# Patient Record
Sex: Male | Born: 2000 | Hispanic: Yes | Marital: Single | Smoking: Never smoker
Health system: Southern US, Community
[De-identification: ages and names within clinical notes are randomized; demographics above are authoritative.]

---
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
## 2023-02-18 ENCOUNTER — Emergency Department (HOSPITAL_COMMUNITY): Payer: Medicaid Other

## 2023-02-18 ENCOUNTER — Encounter (HOSPITAL_COMMUNITY): Payer: Self-pay

## 2023-02-18 ENCOUNTER — Emergency Department (HOSPITAL_COMMUNITY)
Admission: EM | Admit: 2023-02-18 | Discharge: 2023-02-18 | Disposition: A | Discharge status: Home / Self Care | Payer: Medicaid Other | Attending: Emergency Medicine | Admitting: Emergency Medicine

## 2023-02-18 ENCOUNTER — Other Ambulatory Visit: Payer: Self-pay

## 2023-02-18 DIAGNOSIS — F32A Depression, unspecified: Secondary | ICD-10-CM | POA: Diagnosis not present

## 2023-02-18 DIAGNOSIS — F339 Major depressive disorder, recurrent, unspecified: Secondary | ICD-10-CM | POA: Diagnosis present

## 2023-02-18 DIAGNOSIS — F331 Major depressive disorder, recurrent, moderate: Secondary | ICD-10-CM | POA: Diagnosis not present

## 2023-02-18 DIAGNOSIS — F411 Generalized anxiety disorder: Secondary | ICD-10-CM | POA: Diagnosis not present

## 2023-02-18 DIAGNOSIS — F419 Anxiety disorder, unspecified: Secondary | ICD-10-CM | POA: Insufficient documentation

## 2023-02-18 DIAGNOSIS — R000 Tachycardia, unspecified: Secondary | ICD-10-CM | POA: Diagnosis not present

## 2023-02-18 LAB — CBC
HCT: 49.4 % (ref 39.0–52.0)
Hemoglobin: 17 g/dL (ref 13.0–17.0)
MCH: 29.5 pg (ref 26.0–34.0)
MCHC: 34.4 g/dL (ref 30.0–36.0)
MCV: 85.6 fL (ref 80.0–100.0)
Platelets: 474 10*3/uL — ABNORMAL HIGH (ref 150–400)
RBC: 5.77 MIL/uL (ref 4.22–5.81)
RDW: 12.6 % (ref 11.5–15.5)
WBC: 16.1 10*3/uL — ABNORMAL HIGH (ref 4.0–10.5)
nRBC: 0 % (ref 0.0–0.2)

## 2023-02-18 LAB — RAPID URINE DRUG SCREEN, HOSP PERFORMED
Amphetamines: NOT DETECTED
Barbiturates: NOT DETECTED
Benzodiazepines: NOT DETECTED
Cocaine: NOT DETECTED
Opiates: NOT DETECTED
Tetrahydrocannabinol: NOT DETECTED

## 2023-02-18 LAB — COMPREHENSIVE METABOLIC PANEL
ALT: 105 U/L — ABNORMAL HIGH (ref 0–44)
AST: 49 U/L — ABNORMAL HIGH (ref 15–41)
Albumin: 4.6 g/dL (ref 3.5–5.0)
Alkaline Phosphatase: 112 U/L (ref 38–126)
Anion gap: 14 (ref 5–15)
BUN: 16 mg/dL (ref 6–20)
CO2: 21 mmol/L — ABNORMAL LOW (ref 22–32)
Calcium: 10 mg/dL (ref 8.9–10.3)
Chloride: 102 mmol/L (ref 98–111)
Creatinine, Ser: 0.85 mg/dL (ref 0.61–1.24)
GFR, Estimated: >60 mL/min (ref >60)
Glucose, Bld: 99 mg/dL (ref 70–99)
Potassium: 3.6 mmol/L (ref 3.5–5.1)
Sodium: 137 mmol/L (ref 135–145)
Total Bilirubin: 0.7 mg/dL (ref <1.2)
Total Protein: 8.6 g/dL — ABNORMAL HIGH (ref 6.5–8.1)

## 2023-02-18 LAB — ETHANOL: Alcohol, Ethyl (B): <10 mg/dL (ref <10)

## 2023-02-18 LAB — SALICYLATE LEVEL: Salicylate Lvl: <7 mg/dL — ABNORMAL LOW (ref 7.0–30.0)

## 2023-02-18 LAB — ACETAMINOPHEN LEVEL: Acetaminophen (Tylenol), Serum: <10 ug/mL — ABNORMAL LOW (ref 10–30)

## 2023-02-18 MED ORDER — HYDROXYZINE HCL 10 MG PO TABS: 10.0000 mg | ORAL_TABLET | Freq: Three times a day (TID) | ORAL | 0 refills | Status: AC | PRN

## 2023-02-18 MED ORDER — HYDROXYZINE HCL 10 MG PO TABS: 10.0000 mg | ORAL_TABLET | Freq: Three times a day (TID) | ORAL | Status: DC | PRN

## 2023-02-18 NOTE — ED Provider Triage Note (Signed)
Emergency Medicine Provider Triage Evaluation Note  Adryel Laible , a 22 y.o. male  was evaluated in triage.  Pt complains of AMS.  Has been acting strange since around 7PM per mom  has complained of headache recently. Bizarre behavior in triage.  Mom denies drugs or EtOH.  Review of Systems  Positive: Headache, AMS Negative: fever  Physical Exam  BP (!) 149/97 (BP Location: Right Arm)   Pulse (!) 110   Temp 98.3 F (36.8 C)   Resp 19   SpO2 99%  Gen:   Awake, no distress   Resp:  Normal effort  MSK:   Moves extremities without difficulty  Other:  Leaned back in wheelchair, mix of laughing/crying, not talking with spanish interpreter or answer questions when prompted  Medical Decision Making  Medically screening exam initiated at 1:30 AM.  Appropriate orders placed.  [name] was informed that the remainder of the evaluation will be completed by another provider, this initial triage assessment does not replace that evaluation, and the importance of remaining in the ED until their evaluation is complete.  AMS since 7pm.  Mix of laughing/crying in triage.  Also apparently has had headache.  Will get EKG, labs, head CT.   Garlon Hatchet, PA-C 02/18/23 [id number]

## 2023-02-18 NOTE — Consult Note (Addendum)
[facility] Psychiatric Consult Initial  Patient Name: .Steven Cole  MRN: [id number]  DOB: 03/14/2000  Consult Order details:  Orders (From admission, onward)     Start     Ordered   02/18/23 0324  CONSULT TO CALL ACT TEAM       Ordering Provider: Paris Lore, PA-C  Provider:  (Not yet assigned)  Question:  Reason for Consult?  Answer:  Transient manic behavior, hx of depression   02/18/23 0323             Mode of Visit: In person    Psychiatry Consult Evaluation  Service Date: February 18, 2023 LOS:  LOS: 0 days  Chief Complaint: "Nervous breakdown today"  Primary Psychiatric Diagnoses  MDD (major depressive disorder), recurrent episode, moderate (HCC)   Generalized anxiety disorder   Assessment  Steven Cole is a 22 y.o. Hispanic male with a past psychiatric history of unspecified depression, GAD, and adjustment disorder, with pertinent medical comorbidities/history that include none, who presents to this encounter by way of family over concerns of having a, "nervous breakdown today", thus psychiatry was consulted for evaluation and care measure recommendations. Patient is currently voluntary at this time and medically clear per EDP team.  Upon evaluation, patient presents with symptomology that is most consistent with a unipolar major depressive disorder recurrent episode moderate and a generalized anxiety disorder. From evaluation conducted, patient does not present as an imminent risk of self or others, and/or present decompensated into psychosis and/or mania, thus the recommendation is for psychiatric clearance at this time, as well as the additional recommendations listed below. There is no clinical suspicion for substance-induced mood disorder.  There is no clinical suspicion for a bipolar affective disorder.  Patient not amenable to starting scheduled Lexapro to address his mental health, but did endorse that he was amenable to as needed Atarax 10 mg  p.o. 3 times daily/therapy, and after review, no questions or concerns.  Spoke with Dr. Enedina Finner who is in agreement with plan of care and recommendation today for psychiatric clearance and subsequent discharge.  Diagnoses:  Active Hospital problems: Principal Problem:   MDD (major depressive disorder), recurrent episode, moderate (HCC) Active Problems:   Generalized anxiety disorder    Plan   ## Psychiatric Recommendations:   #MDD (major depressive disorder), recurrent episode, moderate (HCC) #Generalized anxiety disorder  -Recommend consider Lexapro 5 mg p.o. daily, if/when patient is amenable -Recommend restart therapy at patient's University -Recommend safety plan listed below -Recommend Atarax 10mg  p.o. TID PRN   Safety Plan Precious Derman will reach out to Mother, call 911 or call mobile crisis, or go to nearest emergency room if condition worsens or if suicidal thoughts become active Patients' will follow up with [facility] for outpatient psychiatric services (therapy/medication management).  The suicide prevention education provided includes the following: Suicide risk factors Suicide prevention and interventions National Suicide Hotline telephone number [facility] assessment telephone number [facility] Emergency Assistance 911 [facility] and/or Residential Mobile Crisis Unit telephone number Request made of family/significant other to:  Mother Remove weapons (e.g., guns, rifles, knives), all items previously/currently identified as safety concern.   Remove drugs/medications (over the counter, prescriptions, illicit drugs), all items previously/currently identified as a safety concern.   ## Medical Decision Making Capacity: Not specifically addressed in this encounter  ## Further Work-up: None at this time  ## Disposition:-- There are no psychiatric contraindications to discharge at this time  ## Behavioral / Environmental: -  No  specific recommendations at this time.     ## Safety and Observation Level:  - Based on my clinical evaluation, I estimate the patient to be at low risk of self harm in the current setting and upon recommendation for discharge. - At this time, we recommend  routine. This decision is based on my review of the chart including patient's history and current presentation, interview of the patient, mental status examination, and consideration of suicide risk including evaluating suicidal ideation, plan, intent, suicidal or self-harm behaviors, risk factors, and protective factors. This judgment is based on our ability to directly address suicide risk, implement suicide prevention strategies, and develop a safety plan while the patient is in the clinical setting. Please contact our team if there is a concern that risk level has changed.  CSSR Risk Category:C-SSRS RISK CATEGORY: No Risk  Suicide Risk Assessment: Patient has following modifiable risk factors for suicide: untreated depression, under treated depression , and social isolation, which we are addressing by recommendations. Patient has following non-modifiable or demographic risk factors for suicide: male gender Patient has the following protective factors against suicide: Access to outpatient mental health care, Supportive family, Supportive friends, no history of suicide attempts, and no history of NSSIB  Thank you for this consult request. Recommendations have been communicated to the primary team.  We will psychiatrically clear the patient at this time.   Lenox Ponds, NP       History of Present Illness   Steven Cole is a 22 y.o. Hispanic male with a past psychiatric history of unspecified depression, GAD, and adjustment disorder, with pertinent medical comorbidities/history that include none, who presents to this encounter by way of family over concerns of having a, "nervous breakdown today", thus psychiatry was consulted for  evaluation and care measure recommendations. Patient is currently voluntary at this time and medically clear per EDP team.  Patient seen today at the [facility] emergency department for face-to-face psychiatric evaluation.  Upon evaluation, patient endorses that last night him and his mother like they usually do were having an evening conversation talking about life, when he states that the conversation became "heated", over feeling dismissed by his mother when attempting to discuss how he is feeling about his life, resulting in a "nervous breakdown, I'm embarrassed honestly."   Expanding on the conversation he was having with his mother about how he has been feeling, and the parts of the conversation that felt, "heated", and made him feel, "dismissed", patient endorsed that he was largely telling his mom about how he feels lonely lately, that he feels like in life he has been "just dealt a terrible hand", he feels "useless to everyone", that 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  Patient endorses that more specifically he has been feeling a significant recrudescence of anxiety and depression over the last 3 weeks since he has been on break from [place] and back living at home with his mother, states that, "I do not know how to explain it, but when I am home, I just feel so isolated and alone, and my mind just wanders into all kinds of bad directions".  Discussing the symptoms he has been experiencing over the last 3 weeks, patient endorses he has been having worsening depressed and anxious mood frequently, anhedonia, increased agitation/irritability, feelings of worthlessness and inappropriate guilt, troubles with concentration, increased  generalized anxiety about psychosocial stressors, and just prior to coming in, states that his conversation with his mother resulted in a  panic attack.  Patient endorses that he does not have a history of utilizing outpatient medication management for his mental health, but does have a long and extensive history of participating in therapy for anxiety depression. Patient endorses that for many years he was being seen for therapy at [facility], but switched over to participating in therapy at his [place] when he started attending college; notably discontinued therapy 3 weeks ago, which is in line with a recrudescence of instability of his mental health.  Patient endorses no inpatient mental health hospitalizations.  Patient endorses no past or present drug use, tobacco use, and/or EtOH use.  Patient endorses no history of suicide attempts and/or self-injurious behavior.  Patient endorses no abuse of caffeine.  Patient endorses he has been sleeping fairly normal, states that because he is on break from school he does stay up, "probably" too late during the nights, but otherwise is sleeping again fairly normal.  Patient endorses no problems with appetite, states that he eats less intentionally, states that he is trying to be healthier.  Patient orientation was intact, no concerns for fluctuations of consciousness.  Patient endorsed no auditory or visual hallucinations, and objectively, did not appear to be presenting with psychotic features.  Patient endorsed no suicidal and/or homicidal ideations.  Patient endorsed that outside of the safe and secure environment of the hospital he could remain safe, states that he is goal-directed towards restarting therapy at his [facility], recommendation given to him today, in addition to the recommendation to consider psychopharmacological intervention in the form of Lexapro, but stated that he wanted to hold off at this time from starting scheduled medications, was amenable to as needed Atarax.  Collateral, patient's mother, Ms. Trisha Mangle, spoken to in person  Patient's mother affirms the information  provided by the patient, does not provide any additional details. Discussed with mother in combination with patient if she had any safety concerns with her son returning home with the recommendation for safety planning to be put in place, as well as to restart outpatient therapy at the patient's University, and consider medications if/when he is amenable, to which mother endorsed she did not have any concerns. Mother endorses no guns or dangerous items in the home that the patient potentially harm himself with.   Review of Systems  Psychiatric/Behavioral:  Positive for depression. Negative for hallucinations, substance abuse and suicidal ideas. The patient is nervous/anxious. The patient does not have insomnia.   All other systems reviewed and are negative.    Psychiatric and Social History  Psychiatric History:  Information collected from: Chart review, patient, patient's mother  Prev Dx/Sx: unspecified depression, GAD, and adjustment disorder Current Psych Provider: None Home Meds (current): None Previous Med Trials: None Therapy: [facility]); recent, [facility] (stopped 3 weeks ago)  Prior Psych Hospitalization: None endorsed Prior Self Harm: None endorsed Prior Violence: None endorsed  Family Psych History: None endorsed Family Hx suicide: None endorsed  Social History:  Developmental Hx: Within defined limits Educational Hx: Currently in [place] Occupational Hx: None endorsed Legal Hx: None endorsed Living Situation: Lives with mother outside of [place] Spiritual Hx: None endorsed Access to weapons/lethal means: None endorsed  Substance History Alcohol: Denied Tobacco: Denied Illicit drugs: Denied   Exam Findings  Physical Exam: As below Vital Signs:  Temp:  [97.4 F (36.3 C)-98.3 F (36.8 C)] 97.4 F (36.3 C) (12/29 0401) Pulse Rate:  [77-110] 77 (12/29 0401) Resp:   [16-28] 16 (12/29 0401) BP: (115-149)/(75-97) 115/75 (12/29 0401) SpO2:  [99 %-100 %] 100 % (12/29 0401) Weight:  [124.7 kg] 124.7 kg (12/29 0313) Blood pressure 115/75, pulse 77, temperature (!) 97.4 F (36.3 C), temperature source Oral, resp. rate 16, height 5\' 7"  (1.702 m), weight 124.7 kg, SpO2 100%. Body mass index is 43.07 kg/m.  Physical Exam Vitals and nursing note reviewed.  Constitutional:      General: He is not in acute distress.    Appearance: He is obese. He is not ill-appearing, toxic-appearing or diaphoretic.  Pulmonary:     Effort: Pulmonary effort is normal.  Skin:    General: Skin is warm and dry.  Neurological:     Mental Status: He is alert and oriented to person, place, and time.  Psychiatric:        Attention and Perception: Attention and perception normal. He does not perceive auditory or visual hallucinations.        Mood and Affect: Affect normal. Mood is depressed.        Speech: Speech normal.        Behavior: Behavior is cooperative.        Thought Content: Thought content normal. Thought content is not paranoid or delusional. Thought content does not include homicidal or suicidal ideation.        Cognition and Memory: Cognition and memory normal.        Judgment: Judgment normal.     Mental Status Exam: General Appearance: Casual  Orientation:  Other:  WDL  Memory:   WDL  Concentration:  Concentration: Good and Attention Span: Good  Recall:  Good  Attention  Good  Eye Contact:  Good  Speech:  Clear and Coherent and Normal Rate  Language:  Good  Volume:  Normal  Mood: "terrible"  Affect:   Neutral sad edge  Thought Process:  Coherent, Goal Directed, and Linear  Thought Content:  Negative  Suicidal Thoughts:  No  Homicidal Thoughts:  No  Judgement:  Intact  Insight:  Present and Shallow  Psychomotor Activity:  Normal  Akathisia:  No  Fund of Knowledge:  Good      Assets:  Communication Skills Desire for Improvement Financial  Resources/Insurance Housing Leisure Time Physical Health Resilience Social Support Talents/Skills Transportation Vocational/Educational  Cognition:  WNL  ADL's:  Intact  AIMS (if indicated):   0     Other History   These have been pulled in through the EMR, reviewed, and updated if appropriate.  Family History:  The patient's family history is not on file.  Medical History: History reviewed. No pertinent past medical history.  Surgical History: History reviewed. No pertinent surgical history.   Medications:  No current facility-administered medications for this encounter.  Current Outpatient Medications:    cetirizine (ZYRTEC) 10 MG tablet, TAKE 1 TABLET BY MOUTH EVERY DAY (Patient not taking: Reported on 12/13/2018), Disp: 30 tablet, Rfl: 6   fluticasone (FLONASE) 50 MCG/ACT nasal spray, Place 1 spray into both nostrils daily. (Patient not taking: Reported on 12/13/2018), Disp: 9.9 mL, Rfl: 2   glycopyrrolate (ROBINUL) 1 MG tablet, Take 1 tablet (1 mg total) by mouth daily. (Patient not taking: Reported on 12/13/2018), Disp: 60 tablet, Rfl: 0   Humidifiers (COOL MIST HUMIDIFIER) MISC, 1 Units by Does not apply route Nightly. (Patient not taking: Reported on 12/13/2018), Disp:  1 each, Rfl: 0   omeprazole (PRILOSEC) 40 MG capsule, Take 1 capsule (40 mg total) by mouth 2 (two) times daily. (Patient not taking: Reported on 08/18/2019), Disp: 30 capsule, Rfl: 0  Allergies: No Known Allergies  Lenox Ponds, NP

## 2023-02-18 NOTE — ED Notes (Signed)
Psych packet complete. Patient belongings in [place] [address] in purple [place]

## 2023-02-18 NOTE — ED Notes (Addendum)
Triage done using ipad spanish interpeter: pt brought in by his mother who reports he has been crying in pain since earlier tonight around 7pm (12/28). He appears to be laughing but also crying, not entirely sure which. He will not answer questions or follow commands, he just keeps laughing/crying out. His mom reports he had been grabbing at his head as if he had a headache. She reports she and the patient's uncle had to assist him out of the car and into the wheelchair.

## 2023-02-18 NOTE — ED Provider Notes (Signed)
[place] [facility] AT [facility] Provider Note   CSN: [id number] Arrival date & time: 02/18/23  0108     History  Chief Complaint  Patient presents with   Manic Behavior   History provided by the patient without use of interpreter, patient fluent in [place]. Steven Cole is a 22 y.o. male who presents with his mother at bedside with concern for "nervous breakdown today".  Per ED triage note, patient was behaving bizarrely in triage, crying hysterically and intermittently laughing, not responding verbally to any questions.  At this time patient is sitting calmly in his hospital bed, speaking clearly and coherently with this provider.  States that he has been feeling quite lonely and hopeless because of various life circumstances to which he alludes, and feels that all culminated today in a "nervous breakdown".  States he feels ashamed of his presentation and would like resources.  Previously was seeing outpatient counseling but has not for a few months.  No history of medications for behavioral health concerns.  Denies SI HI or AVH.  HPI     Home Medications Prior to Admission medications   Medication Sig Start Date End Date Taking? Authorizing Provider  cetirizine (ZYRTEC) 10 MG tablet TAKE 1 TABLET BY MOUTH EVERY DAY Patient not taking: Reported on 12/13/2018 11/18/18   Darrall Dears, MD  fluticasone (FLONASE) 50 MCG/ACT nasal spray Place 1 spray into both nostrils daily. Patient not taking: Reported on 12/13/2018 10/28/18   Dana Allan, MD  glycopyrrolate (ROBINUL) 1 MG tablet Take 1 tablet (1 mg total) by mouth daily. Patient not taking: Reported on 12/13/2018 10/27/18   Dana Allan, MD  Humidifiers (COOL MIST HUMIDIFIER) MISC 1 Units by Does not apply route Nightly. Patient not taking: Reported on 12/13/2018 11/15/18   Charlett Nose, MD  omeprazole (PRILOSEC) 40 MG capsule Take 1 capsule (40 mg total) by mouth 2 (two) times daily. Patient not  taking: Reported on 08/18/2019 10/27/18   Dana Allan, MD      Allergies    Patient has no known allergies.    Review of Systems   Review of Systems  Psychiatric/Behavioral:  Positive for agitation and dysphoric mood. The patient is nervous/anxious.     Physical Exam Updated Vital Signs BP 115/75 (BP Location: Right Arm)   Pulse 77   Temp (!) 97.4 F (36.3 C) (Oral)   Resp 16   Ht 5\' 7"  (1.702 m)   Wt 124.7 kg   SpO2 100%   BMI 43.07 kg/m  Physical Exam Vitals and nursing note reviewed.  Constitutional:      Appearance: He is obese. He is not ill-appearing or toxic-appearing.  HENT:     Head: Normocephalic and atraumatic.     Mouth/Throat:     Mouth: Mucous membranes are moist.     Pharynx: No oropharyngeal exudate or posterior oropharyngeal erythema.  Eyes:     General:        Right eye: No discharge.        Left eye: No discharge.     Conjunctiva/sclera: Conjunctivae normal.  Cardiovascular:     Rate and Rhythm: Normal rate and regular rhythm.     Pulses: Normal pulses.     Heart sounds: Normal heart sounds. No murmur heard. Pulmonary:     Effort: Pulmonary effort is normal. No respiratory distress.     Breath sounds: Normal breath sounds. No wheezing or rales.  Abdominal:     General: Bowel sounds  are normal. There is no distension.     Palpations: Abdomen is soft.     Tenderness: There is no abdominal tenderness.  Musculoskeletal:        General: No deformity.     Cervical back: Neck supple.  Skin:    General: Skin is warm and dry.     Capillary Refill: Capillary refill takes less than 2 seconds.  Neurological:     Mental Status: He is alert. Mental status is at baseline.  Psychiatric:        Attention and Perception: Attention normal.        Mood and Affect: Mood is depressed. Affect is flat.        Behavior: Behavior normal. Behavior is cooperative.        Thought Content: Thought content does not include homicidal or suicidal ideation.     ED  Results / Procedures / Treatments   Labs (all labs ordered are listed, but only abnormal results are displayed) Labs Reviewed  COMPREHENSIVE METABOLIC PANEL - Abnormal; Notable for the following components:      Result Value   CO2 21 (*)    Total Protein 8.6 (*)    AST 49 (*)    ALT 105 (*)    All other components within normal limits  SALICYLATE LEVEL - Abnormal; Notable for the following components:   Salicylate Lvl <7.0 (*)    All other components within normal limits  ACETAMINOPHEN LEVEL - Abnormal; Notable for the following components:   Acetaminophen (Tylenol), Serum <10 (*)    All other components within normal limits  CBC - Abnormal; Notable for the following components:   WBC 16.1 (*)    Platelets 474 (*)    All other components within normal limits  ETHANOL  RAPID URINE DRUG SCREEN, HOSP PERFORMED    EKG None  Radiology CT HEAD WO CONTRAST ( ) Result Date: 02/18/2023 CLINICAL DATA:  Manic behavior, head trauma EXAM: CT HEAD WITHOUT CONTRAST TECHNIQUE: Contiguous axial images were obtained from the base of the skull through the vertex without intravenous contrast. RADIATION DOSE REDUCTION: This exam was performed according to the departmental dose-optimization program which includes automated exposure control, adjustment of the mA and/or kV according to patient size and/or use of iterative reconstruction technique. COMPARISON:  None Available. FINDINGS: Brain: No evidence of acute infarction, hemorrhage, mass, mass effect, or midline shift. No hydrocephalus or extra-axial fluid collection. Vascular: No hyperdense vessel. Skull: Negative for fracture or focal lesion. Sinuses/Orbits: No acute finding. Other: The mastoid air cells are well aerated. IMPRESSION: No acute intracranial process. Electronically Signed   By: Wiliam Ke M.D.   On: 02/18/2023 03:20    Procedures Procedures    Medications Ordered in ED Medications - No data to display  ED Course/ Medical  Decision Making/ A&P                                 Medical Decision Making 22 year old male with erratic behavior.  History of depression.  Tachycardic on intake so crying.  Tachycardia resolved at time my evaluation now with normal vital signs.  Normal cardiopulmonary exam, patient not acutely psychotic and does not appear to be danger to himself or others on psychologic exam.   Amount and/or Complexity of Data Reviewed Labs: ordered.    Details: CBC with leukocytosis of 16,000, CMP with transaminitis with AST/ALT 49/105.  Salicylate alcohol and acetaminophen levels normal, UDS  negative.  Radiology:     Details: CT head negative.  ECG/medicine tests:     Details:  EKG difficult to interpret due to artifact, however no ischemic changes identified.    Do feel he would benefit from discussion with behavioral health provider while in the emergency department.  TTS evaluation placed and pending at this time.  Patient is medically cleared for TTS eval at this time.  Pending TTS at time of shift change. Care of this patient signed out to oncoming ED provider at time of shift change. All pertinent HPI, physical exam, and laboratory findings were discussed with them prior to my departure. Disposition of patient pending completion of workup, reevaluation, and clinical judgement of oncoming ED provider.   This chart was dictated using voice recognition software, Dragon. Despite the best efforts of this provider to proofread and correct errors, errors may still occur which can change documentation meaning.        Final Clinical Impression(s) / ED Diagnoses Final diagnoses:  None    Rx / DC Orders ED Discharge Orders     None         Sherrilee Gilles 02/18/23 [id number]    Sabas Sous, MD 02/18/23 [phone]

## 2023-02-18 NOTE — BH Assessment (Signed)
Pt will be seen by psychiatry  in person during next shift due to an interpreter needed.

## 2023-02-18 NOTE — ED Notes (Signed)
TTS evaluation will be in the morning during day shift due to need for interpreter

## 2023-02-18 NOTE — ED Provider Notes (Signed)
Emergency Medicine Observation Re-evaluation Note  Steven Cole is a 22 y.o. male, seen on rounds today.  Pt initially presented to the ED for complaints of Manic Behavior Currently, the patient is awaiting TTS evaluation.  Physical Exam  BP 115/75 (BP Location: Right Arm)   Pulse 77   Temp (!) 97.4 F (36.3 C) (Oral)   Resp 16   Ht 5\' 7"  (1.702 m)   Wt 124.7 kg   SpO2 100%   BMI 43.07 kg/m  Physical Exam General: Calm Cardiac: Well perfused Lungs: even respirations Psych: Calm  ED Course / MDM  EKG:   I have reviewed the labs performed to date as well as medications administered while in observation.  Recent changes in the last 24 hours include TTS evaluation with plan for d/c with resources.  Plan  Current plan is for d/c with resources.    Maia Plan, MD 02/18/23 [phone]

## 2023-02-18 NOTE — Discharge Instructions (Addendum)
Please perform close outpatient follow-up with your therapy provider at [facility]. Please adhere strictly to safety plan created below today.   Safety Plan Steven Cole will reach out to Mother, call 911 or call mobile crisis, or go to nearest emergency room if condition worsens or if suicidal thoughts become active Patients' will follow up with [facility] for outpatient psychiatric services (therapy/medication management).  The suicide prevention education provided includes the following: Suicide risk factors Suicide prevention and interventions National Suicide Hotline telephone number [facility] assessment telephone number [facility] Emergency Assistance 911 [facility] and/or Residential Mobile Crisis Unit telephone number Request made of family/significant other to:  Mother Remove weapons (e.g., guns, rifles, knives), all items previously/currently identified as safety concern.   Remove drugs/medications (over the counter, prescriptions, illicit drugs), all items previously/currently identified as a safety concern.

## 2023-02-18 NOTE — BH Assessment (Signed)
Gordy Councilman, RN messaged this clinician stating that she is unsure why an interpreter was used during triage but pt speaks [place]. Clinician will update next shift. Pt will be seen by AM provider.

## 2023-06-11 DIAGNOSIS — Z20822 Contact with and (suspected) exposure to covid-19: Secondary | ICD-10-CM | POA: Diagnosis not present

## 2023-06-11 DIAGNOSIS — E860 Dehydration: Secondary | ICD-10-CM | POA: Diagnosis not present

## 2023-06-11 DIAGNOSIS — K760 Fatty (change of) liver, not elsewhere classified: Secondary | ICD-10-CM | POA: Diagnosis not present

## 2023-06-12 DIAGNOSIS — K760 Fatty (change of) liver, not elsewhere classified: Secondary | ICD-10-CM | POA: Diagnosis not present

## 2023-09-03 DIAGNOSIS — R0789 Other chest pain: Secondary | ICD-10-CM | POA: Diagnosis not present

## 2023-09-03 DIAGNOSIS — R1010 Upper abdominal pain, unspecified: Secondary | ICD-10-CM | POA: Diagnosis not present

## 2023-09-03 DIAGNOSIS — F32A Depression, unspecified: Secondary | ICD-10-CM | POA: Diagnosis not present

## 2023-09-03 DIAGNOSIS — G43119 Migraine with aura, intractable, without status migrainosus: Secondary | ICD-10-CM | POA: Diagnosis not present

## 2023-12-21 DIAGNOSIS — G4710 Hypersomnia, unspecified: Secondary | ICD-10-CM | POA: Diagnosis not present
